# Patient Record
Sex: Male | Born: 2000 | Race: Black or African American | Hispanic: No | Marital: Single | Smoking: Never smoker
Health system: Southern US, Community
[De-identification: ages and names within clinical notes are randomized; demographics above are authoritative.]

## PROBLEM LIST (undated history)

## (undated) ENCOUNTER — Emergency Department (HOSPITAL_COMMUNITY): Payer: Medicaid Other

## (undated) DIAGNOSIS — K219 Gastro-esophageal reflux disease without esophagitis: Secondary | ICD-10-CM

## (undated) HISTORY — DX: Gastro-esophageal reflux disease without esophagitis: K21.9

## (undated) HISTORY — PX: UPPER GASTROINTESTINAL ENDOSCOPY: SHX188

---
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
## 2021-12-15 ENCOUNTER — Emergency Department (HOSPITAL_COMMUNITY)
Admission: EM | Admit: 2021-12-15 | Discharge: 2021-12-15 | Disposition: A | Discharge status: Home / Self Care | Payer: Medicaid Other | Attending: Emergency Medicine | Admitting: Emergency Medicine

## 2021-12-15 ENCOUNTER — Encounter (HOSPITAL_COMMUNITY): Payer: Self-pay | Admitting: Emergency Medicine

## 2021-12-15 ENCOUNTER — Other Ambulatory Visit: Payer: Self-pay

## 2021-12-15 ENCOUNTER — Emergency Department (HOSPITAL_COMMUNITY): Payer: Medicaid Other

## 2021-12-15 DIAGNOSIS — R509 Fever, unspecified: Secondary | ICD-10-CM | POA: Insufficient documentation

## 2021-12-15 DIAGNOSIS — R1010 Upper abdominal pain, unspecified: Secondary | ICD-10-CM | POA: Insufficient documentation

## 2021-12-15 DIAGNOSIS — R112 Nausea with vomiting, unspecified: Secondary | ICD-10-CM | POA: Insufficient documentation

## 2021-12-15 LAB — URINALYSIS, ROUTINE W REFLEX MICROSCOPIC
Glucose, UA: NEGATIVE mg/dL
Hgb urine dipstick: NEGATIVE
Ketones, ur: 15 mg/dL — AB
Leukocytes,Ua: NEGATIVE
Nitrite: NEGATIVE
Protein, ur: NEGATIVE mg/dL
Specific Gravity, Urine: >1.03 — ABNORMAL HIGH (ref 1.005–1.030)
pH: 6 (ref 5.0–8.0)

## 2021-12-15 LAB — COMPREHENSIVE METABOLIC PANEL
ALT: 14 U/L (ref 0–44)
AST: 19 U/L (ref 15–41)
Albumin: 4.6 g/dL (ref 3.5–5.0)
Alkaline Phosphatase: 93 U/L (ref 38–126)
Anion gap: 13 (ref 5–15)
BUN: 15 mg/dL (ref 6–20)
CO2: 22 mmol/L (ref 22–32)
Calcium: 9.7 mg/dL (ref 8.9–10.3)
Chloride: 105 mmol/L (ref 98–111)
Creatinine, Ser: 0.94 mg/dL (ref 0.61–1.24)
GFR, Estimated: >60 mL/min (ref >60)
Glucose, Bld: 99 mg/dL (ref 70–99)
Potassium: 3.5 mmol/L (ref 3.5–5.1)
Sodium: 140 mmol/L (ref 135–145)
Total Bilirubin: 2.8 mg/dL — ABNORMAL HIGH (ref 0.3–1.2)
Total Protein: 7.4 g/dL (ref 6.5–8.1)

## 2021-12-15 LAB — CBC
HCT: 44.5 % (ref 39.0–52.0)
Hemoglobin: 14.8 g/dL (ref 13.0–17.0)
MCH: 31.3 pg (ref 26.0–34.0)
MCHC: 33.3 g/dL (ref 30.0–36.0)
MCV: 94.1 fL (ref 80.0–100.0)
Platelets: 262 10*3/uL (ref 150–400)
RBC: 4.73 MIL/uL (ref 4.22–5.81)
RDW: 13.2 % (ref 11.5–15.5)
WBC: 9.8 10*3/uL (ref 4.0–10.5)
nRBC: 0 % (ref 0.0–0.2)

## 2021-12-15 LAB — LIPASE, BLOOD: Lipase: 25 U/L (ref 11–51)

## 2021-12-15 MED ORDER — IOHEXOL 350 MG/ML SOLN
100.0000 mL | Freq: Once | INTRAVENOUS | Status: AC | PRN
  Administered 2021-12-15: 100 mL via INTRAVENOUS

## 2021-12-15 MED ORDER — ONDANSETRON HCL 4 MG PO TABS: 4.0000 mg | ORAL_TABLET | Freq: Three times a day (TID) | ORAL | 0 refills | Status: DC | PRN

## 2021-12-15 MED ORDER — SODIUM CHLORIDE 0.9 % IV BOLUS
1000.0000 mL | Freq: Once | INTRAVENOUS | Status: AC
  Administered 2021-12-15: 1000 mL via INTRAVENOUS

## 2021-12-15 MED ORDER — ONDANSETRON HCL 4 MG/2ML IJ SOLN
4.0000 mg | Freq: Once | INTRAMUSCULAR | Status: AC
  Administered 2021-12-15: 4 mg via INTRAVENOUS
  Filled 2021-12-15: qty 2

## 2021-12-15 NOTE — ED Triage Notes (Signed)
Patient c/o upper abdominal pain with nausea and vomiting onset of yesterday. Denies any urinary symptoms.

## 2021-12-15 NOTE — ED Provider Notes (Signed)
[facility] EMERGENCY DEPARTMENT Provider Note   CSN: [id number] Arrival date & time: 12/15/21  2993     History  Chief Complaint  Patient presents with   Abdominal Pain    Walter Mathews is a 21 y.o. male who presents to the ED complaining of upper abdominal pain onset yesterday. Has associated subjective, fever, nausea, vomiting. No sick contacts at home. No meds tried PTA. Still has his gallbladder and appendix. Denies urinary symptoms. Denies history of GERD. No new meds or foods.    The history is provided by the patient. No language interpreter was used.       Home Medications Prior to Admission medications   Medication Sig Start Date End Date Taking? Authorizing Provider  ondansetron (ZOFRAN) 4 MG tablet Take 1 tablet (4 mg total) by mouth every 8 (eight) hours as needed for nausea or vomiting. 12/15/21  Yes Masayoshi Couzens A, PA-C  ibuprofen (ADVIL,MOTRIN) 200 MG tablet Take 100 mg by mouth every 6 (six) hours as needed. For pain    [provider]      Allergies    Patient has no known allergies.    Review of Systems   Review of Systems  Constitutional:  Positive for fever (subjective).  Gastrointestinal:  Positive for abdominal pain, nausea and vomiting.  Genitourinary:  Negative for dysuria and hematuria.  All other systems reviewed and are negative.   Physical Exam Updated Vital Signs BP (!) 145/75 (BP Location: Right Arm)   Pulse 60   Temp 98.6 F (37 C) (Oral)   Resp 14   Ht 6' (1.829 m)   SpO2 100%  Physical Exam Vitals and nursing note reviewed.  Constitutional:      General: He is not in acute distress.    Appearance: He is not diaphoretic.  HENT:     Head: Normocephalic and atraumatic.     Mouth/Throat:     Pharynx: No oropharyngeal exudate.  Eyes:     General: No scleral icterus.    Conjunctiva/sclera: Conjunctivae normal.  Cardiovascular:     Rate and Rhythm: Normal rate and regular rhythm.      Pulses: Normal pulses.     Heart sounds: Normal heart sounds.  Pulmonary:     Effort: Pulmonary effort is normal. No respiratory distress.     Breath sounds: Normal breath sounds. No wheezing.  Abdominal:     General: Bowel sounds are normal.     Palpations: Abdomen is soft. There is no mass.     Tenderness: There is abdominal tenderness. There is no guarding or rebound.     Comments: Upper abdominal TTP on exam.   Musculoskeletal:        General: Normal range of motion.     Cervical back: Normal range of motion and neck supple.  Skin:    General: Skin is warm and dry.  Neurological:     Mental Status: He is alert.  Psychiatric:        Behavior: Behavior normal.     ED Results / Procedures / Treatments   Labs (all labs ordered are listed, but only abnormal results are displayed) Labs Reviewed  COMPREHENSIVE METABOLIC PANEL - Abnormal; Notable for the following components:      Result Value   Total Bilirubin 2.8 (*)    All other components within normal limits  URINALYSIS, ROUTINE W REFLEX MICROSCOPIC - Abnormal; Notable for the following components:   Specific Gravity, Urine >1.030 (*)  Bilirubin Urine SMALL (*)    Ketones, ur 15 (*)    All other components within normal limits  LIPASE, BLOOD  CBC    EKG None  Radiology CT ABDOMEN PELVIS W CONTRAST  Result Date: 12/15/2021 CLINICAL DATA:  A 21 year old male presents for evaluation of abdominal pain, upper abdominal pain with nausea and vomiting that began yesterday. EXAM: CT ABDOMEN AND PELVIS WITH CONTRAST TECHNIQUE: Multidetector CT imaging of the abdomen and pelvis was performed using the standard protocol following bolus administration of intravenous contrast. RADIATION DOSE REDUCTION: This exam was performed according to the departmental dose-optimization program which includes automated exposure control, adjustment of the mA and/or kV according to patient size and/or use of iterative reconstruction technique.  CONTRAST:  OMNIPAQUE IOHEXOL 350 MG/ML SOLN COMPARISON:  None Available. FINDINGS: Lower chest: Lung bases are clear. No effusion. No consolidative changes. Hepatobiliary: No focal, suspicious hepatic lesion. No pericholecystic stranding. No pericholecystic stranding or signs of overt biliary duct distension. Question mild low-attenuation along portal tracks which could represent minimal intrahepatic biliary duct distension versus is mild periportal edema. Pancreas: Mildly indistinct appearance of the pancreas, could be related to technical factors and reduced though CT. No peripancreatic fluid. No ductal dilation or gross contour abnormality. Spleen: Normal. Adrenals/Urinary Tract: Adrenal glands are unremarkable. Symmetric renal enhancement. No sign of hydronephrosis. No suspicious renal lesion or perinephric stranding. Urinary bladder is grossly unremarkable. Stomach/Bowel: Normal appendix. Stomach is under distended without signs of adjacent stranding. No sign of small bowel obstruction. No acute small bowel process. Colon is largely collapsed. No stranding adjacent to the colon. Vascular/Lymphatic: Aorta with smooth contours. IVC with smooth contours. No aneurysmal dilation of the abdominal aorta. There is no gastrohepatic or hepatoduodenal ligament lymphadenopathy. No retroperitoneal or mesenteric lymphadenopathy. No pelvic sidewall lymphadenopathy. Reproductive: Unremarkable by CT. Other: No pneumoperitoneum. Trace free fluid in the pelvis is of uncertain significance and is non loculated in appearance. Musculoskeletal: No acute or significant osseous findings. IMPRESSION: 1. Mildly indistinct appearance of the pancreas, could be related to technical factors and reduced though CT. Correlate with pancreatic enzymes. 2. Trace free fluid in the pelvis is of uncertain significance and is non loculated in appearance. May relate to underlying inflammation and could be seen in a variety of conditions including  gastroenteritis. 3. Query trace intrahepatic biliary duct distension, significance uncertain versus mild periportal edema. 4. Normal appendix. Electronically Signed   By: Donzetta Kohut M.D.   On: 12/15/2021 12:55    Procedures Procedures    Medications Ordered in ED Medications  sodium chloride 0.9 % bolus 1,000 mL (0 mLs Intravenous Stopped 12/15/21 1226)  ondansetron (ZOFRAN) injection 4 mg (4 mg Intravenous Given 12/15/21 1123)  iohexol (OMNIPAQUE) 350 MG/ML injection 100 mL (100 mLs Intravenous Contrast Given 12/15/21 1235)    ED Course/ Medical Decision Making/ A&P Clinical Course as of 12/16/21 3244  Tue Dec 15, 2021  1225 Patient reevaluated and resting comfortably on stretcher.  Notes improvement of symptoms with treatment regimen in the ED. [SB]    Clinical Course User Index [SB] Tavien Chestnut A, PA-C                           Medical Decision Making Amount and/or Complexity of Data Reviewed Labs: ordered. Radiology: ordered.  Risk Prescription drug management.   Patient presents to the emergency department with upper abdominal pain onset yesterday.  Vital signs stable, patient afebrile, not tachycardic or hypoxic.  On exam patient with mild tenderness to palpation in the epigastric region.  No acute cardiovascular or respiratory exam findings. Differential diagnosis includes pancreatitis, cholecystitis, GERD.   Labs:  I ordered, and personally interpreted labs.  The pertinent results include:  Lipase at 25 and unremarkable CBC unremarkable.  CMP with slightly elevated bilirubin at 2.8, otherwise unremarkable.  Urinalysis unremarkable.   Imaging: I ordered imaging studies including CT abdomen pelvis I independently visualized and interpreted imaging which showed  1. Mildly indistinct appearance of the pancreas, could be related to  technical factors and reduced though CT. Correlate with pancreatic  enzymes.  2. Trace free fluid in the pelvis is of uncertain  significance and  is non loculated in appearance. May relate to underlying  inflammation and could be seen in a variety of conditions including  gastroenteritis.  3. Query trace intrahepatic biliary duct distension, significance  uncertain versus mild periportal edema.  4. Normal appendix.   I agree with the radiologist interpretation  Medications:  I ordered medication including IV fluids and Zofran for symptom management  Reevaluation of the patient after these medicines and interventions, I reevaluated the patient and found that they have improved I have reviewed the patients home medicines and have made adjustments as needed  Disposition: Presentation suspicious for viral etiology of abdominal pain, nausea, vomiting.  Doubt GERD at this time.  Doubt pancreatitis at this time, lipase unremarkable, patient afebrile, no elevated WBC in the ED.  Doubt cholecystitis at this time. After consideration of the diagnostic results and the patients response to treatment, I feel that the patient would benefit from Discharge home.  Patient will be discharged home with a prescription for Zofran.  Supportive care measures and strict return precautions discussed with patient at bedside. Pt acknowledges and verbalizes understanding. Pt appears safe for discharge. Follow up as indicated in discharge paperwork.   This chart was dictated using voice recognition software, Dragon. Despite the best efforts of this provider to proofread and correct errors, errors may still occur which can change documentation meaning.  Final Clinical Impression(s) / ED Diagnoses Final diagnoses:  Pain of upper abdomen    Rx / DC Orders ED Discharge Orders          Ordered    ondansetron (ZOFRAN) 4 MG tablet  Every 8 hours PRN        12/15/21 1312              Ryelan Kazee A, PA-C 12/16/21 0715    Lacretia Leigh, MD 12/17/21 [phone]

## 2022-10-19 ENCOUNTER — Emergency Department (HOSPITAL_COMMUNITY)
Admission: EM | Admit: 2022-10-19 | Discharge: 2022-10-19 | Disposition: A | Discharge status: Home / Self Care | Payer: Self-pay | Attending: Emergency Medicine | Admitting: Emergency Medicine

## 2022-10-19 ENCOUNTER — Emergency Department (HOSPITAL_COMMUNITY): Payer: Medicaid Other

## 2022-10-19 ENCOUNTER — Encounter (HOSPITAL_COMMUNITY): Payer: Self-pay

## 2022-10-19 DIAGNOSIS — M25511 Pain in right shoulder: Secondary | ICD-10-CM

## 2022-10-19 DIAGNOSIS — M62838 Other muscle spasm: Secondary | ICD-10-CM | POA: Insufficient documentation

## 2022-10-19 MED ORDER — IBUPROFEN 400 MG PO TABS
600.0000 mg | ORAL_TABLET | Freq: Once | ORAL | Status: AC
  Administered 2022-10-19: 600 mg via ORAL
  Filled 2022-10-19: qty 1

## 2022-10-19 MED ORDER — ACETAMINOPHEN 500 MG PO TABS
1000.0000 mg | ORAL_TABLET | Freq: Once | ORAL | Status: AC
  Administered 2022-10-19: 1000 mg via ORAL
  Filled 2022-10-19: qty 2

## 2022-10-19 MED ORDER — LIDOCAINE 5 % EX PTCH
1.0000 | MEDICATED_PATCH | CUTANEOUS | Status: DC
  Administered 2022-10-19: 1 via TRANSDERMAL
  Filled 2022-10-19: qty 1

## 2022-10-19 MED ORDER — IBUPROFEN 600 MG PO TABS: 600.0000 mg | ORAL_TABLET | Freq: Four times a day (QID) | ORAL | 0 refills | Status: DC | PRN

## 2022-10-19 MED ORDER — METHOCARBAMOL 500 MG PO TABS: 500.0000 mg | ORAL_TABLET | Freq: Two times a day (BID) | ORAL | 0 refills | Status: DC

## 2022-10-19 NOTE — ED Triage Notes (Signed)
Right shoulder pain that started while at work. Works as a [employer] worker. Pt denies injury or falls. Able to move RUE today but reports unable to move right arm last night when pain started.

## 2022-10-19 NOTE — Discharge Instructions (Addendum)
You were seen in the ER today for evaluation of right shoulder pain.  Likely given muscle spasm.  For this, recommend gentle stretching and heat and ice to the area.  Can also try over-the-counter lidocaine patches.  I prescribed you some muscle relaxers for you to take as needed.  Please do not drive or operate heavy machinery on these medications as they can make you sleepy.  I recommended 1000 g of Tylenol and/or 600 mg of ibuprofen every 6 hours as needed for pain.  If you have any concerns with new or worsening symptoms, please return to the nearest emergency room for evaluation.  Contact a doctor if: Your pain gets worse. Medicine does not help your pain. You have new pain in your arm, hand, or fingers. You loosen your sling and your arm, hand, or fingers: Tingle. Are numb. Are swollen. Get help right away if: Your arm, hand, or fingers turn white or blue.

## 2022-10-19 NOTE — ED Provider Notes (Signed)
[place] EMERGENCY DEPARTMENT AT [facility] Provider Note   CSN: [id number] Arrival date & time: 10/19/22  1030     History  Chief Complaint  Patient presents with   Shoulder Pain    Walter Mathews is a 22 y.o. male presents to the ER for evaluation of right shoulder pain since last night.  Patient works as a Nature conservation officer at [employer] reports that he had some pain in his upper shoulder whenever he was lifting a heavy box.  Reports that he has a lot of pain in his shoulder last time he had trouble moving hours feeling better this morning.  Denies any numbness or tingling.  Denies any chest pain or shortness of breath. He has not tried any medications for pain.  Denies any head neck or back pain.  No known drug allergies.   Shoulder Pain Associated symptoms: no back pain, no fever and no neck pain        Home Medications Prior to Admission medications   Medication Sig Start Date End Date Taking? Authorizing Provider  ibuprofen (ADVIL,MOTRIN) 200 MG tablet Take 100 mg by mouth every 6 (six) hours as needed. For pain    [provider]  ondansetron (ZOFRAN) 4 MG tablet Take 1 tablet (4 mg total) by mouth every 8 (eight) hours as needed for nausea or vomiting. 12/15/21   Blue, Soijett A, PA-C      Allergies    Patient has no known allergies.    Review of Systems   Review of Systems  Constitutional:  Negative for chills and fever.  Respiratory:  Negative for shortness of breath.   Cardiovascular:  Negative for chest pain.  Musculoskeletal:  Positive for arthralgias and myalgias. Negative for back pain and neck pain.  Neurological:  Negative for weakness, numbness and headaches.    Physical Exam Updated Vital Signs BP 126/72   Pulse (!) 101   Temp 99.6 F (37.6 C) (Oral)   Resp 16   Ht 6' (1.829 m)   Wt 54.4 kg   SpO2 99%   BMI 16.27 kg/m  Physical Exam Vitals and nursing note reviewed.  Constitutional:      General: He is not in  acute distress.    Appearance: Normal appearance. He is not ill-appearing or toxic-appearing.     Comments: On phone, in no acute distress  HENT:     Mouth/Throat:     Mouth: Mucous membranes are moist.  Eyes:     General: No scleral icterus. Pulmonary:     Effort: Pulmonary effort is normal. No respiratory distress.  Musculoskeletal:       Arms:     Comments: Tenderness to the above marked area with palpable muscle spasm.  Patient able to raise arms without any drift.  Strength is 5-5 bilaterally and patient is upper extremities.  Sensation intact per patient.  Coloration temperature of feel and appear symmetric.  Palpable radial pulses are symmetric as well.  Brisk cap refill.  Compartments are soft.  No signs of trauma to the back.  There is no induration, fluctuance, increased warmth, or erythema to the back.  No midline tenderness of the cervical, thoracic, or lumbar spine.  Skin:    General: Skin is dry.     Findings: No rash.  Neurological:     General: No focal deficit present.     Mental Status: He is alert. Mental status is at baseline.  Psychiatric:  Mood and Affect: Mood normal.     ED Results / Procedures / Treatments   Labs (all labs ordered are listed, but only abnormal results are displayed) Labs Reviewed - No data to display  EKG None  Radiology DG Shoulder Right  Result Date: 10/19/2022 CLINICAL DATA:  Posterosuperior right shoulder pain for 2 days EXAM: RIGHT SHOULDER - 2+ VIEW COMPARISON:  None Available. FINDINGS: No fracture, malalignment, or acute bony findings. Subacromial morphology is type 2 (curved). IMPRESSION: 1. No acute findings. If pain persists despite conservative therapy, MRI may be warranted for further characterization. Electronically Signed   By: Gaylyn Rong M.D.   On: 10/19/2022 11:35    Procedures Procedures    Medications Ordered in ED Medications  acetaminophen (TYLENOL) tablet 1,000 mg (has no administration in time  range)  ibuprofen (ADVIL) tablet 600 mg (has no administration in time range)  lidocaine (LIDODERM) 5 % 1 patch (has no administration in time range)    ED Course/ Medical Decision Making/ A&P                            Medical Decision Making Amount and/or Complexity of Data Reviewed Radiology: ordered.  Risk OTC drugs. Prescription drug management.   22 y.o. male presents to the ER today for evaluation of right shoulder pain. Differential diagnosis includes but is not limited to sprain, strain, muscle spasm, dislocation, fracture, referred pain. Vital signs unremarkable. Physical exam as noted above.   XR right shoulder shows 1. No acute findings. If pain persists despite conservative therapy, MRI may be warranted for further characterization.  Patient was ordered Tylenol and ibuprofen while here and a lidocaine patch.  Will send him home with some muscle.  Encouraged him to try Tylenol ibuprofen at home.  He has palpable pulses and is neurovascularly intact.  There is no signs of compartment syndrome.  Strength is intact and equal.  He has a palpable muscle spasm to the more superior aspect of the right shoulder, likely what is causing his pain from doing heavy lifting.  He safer discharge home.  Recommended that if he has continued pain in his right shoulder follow-up with orthopedics.  We discussed plan at bedside. We discussed strict return precautions and red flag symptoms. The patient verbalized their understanding and agrees to the plan. The patient is stable and being discharged home in good condition.  Portions of this report may have been transcribed using voice recognition software. Every effort was made to ensure accuracy; however, inadvertent computerized transcription errors may be present.   Final Clinical Impression(s) / ED Diagnoses Final diagnoses:  Acute pain of right shoulder  Muscle spasm    Rx / DC Orders ED Discharge Orders          Ordered    ibuprofen  (ADVIL) 600 MG tablet  Every 6 hours PRN        10/19/22 1235    methocarbamol (ROBAXIN) 500 MG tablet  2 times daily        10/19/22 1235              Achille Rich, [place] 10/19/22 1237    Loetta Rough, MD 10/22/22 1235

## 2022-10-27 ENCOUNTER — Other Ambulatory Visit: Payer: Self-pay

## 2022-10-27 ENCOUNTER — Ambulatory Visit (INDEPENDENT_AMBULATORY_CARE_PROVIDER_SITE_OTHER): Payer: Self-pay

## 2022-10-27 ENCOUNTER — Encounter (HOSPITAL_COMMUNITY): Payer: Self-pay | Admitting: *Deleted

## 2022-10-27 ENCOUNTER — Ambulatory Visit (HOSPITAL_COMMUNITY)
Admission: EM | Admit: 2022-10-27 | Discharge: 2022-10-27 | Disposition: A | Discharge status: Home / Self Care | Payer: Self-pay | Attending: Emergency Medicine | Admitting: Emergency Medicine

## 2022-10-27 DIAGNOSIS — K5900 Constipation, unspecified: Secondary | ICD-10-CM

## 2022-10-27 DIAGNOSIS — R21 Rash and other nonspecific skin eruption: Secondary | ICD-10-CM

## 2022-10-27 MED ORDER — GLYCERIN (ADULT) 2 G RE SUPP: 1.0000 | RECTAL | 0 refills | Status: DC | PRN

## 2022-10-27 MED ORDER — POLYETHYLENE GLYCOL 3350 17 G PO PACK: 17.0000 g | PACK | Freq: Every day | ORAL | 0 refills | Status: DC

## 2022-10-27 MED ORDER — DOCUSATE SODIUM 100 MG PO CAPS: 100.0000 mg | ORAL_CAPSULE | Freq: Two times a day (BID) | ORAL | 0 refills | Status: DC

## 2022-10-27 MED ORDER — DIPHENHYDRAMINE HCL 25 MG PO TABS: 25.0000 mg | ORAL_TABLET | Freq: Four times a day (QID) | ORAL | 0 refills | Status: DC | PRN

## 2022-10-27 NOTE — Discharge Instructions (Addendum)
Your x-ray did not show evidence of obstruction.  It did show significant stool burden, you are constipated.  Please stop using the milk of magnesia as this is given you a rash.  You can do an over-the-counter moisturizer like CeraVe or Cetaphil that is unscented and nonirritating.  You can also take Benadryl 25 mg every 6 hours as needed for rash and itching.  Do not drink or drive on this medication as it may make you drowsy.  For moderate to severe constipation (not having a bowel movement in more than 3 days) try to use Miralax or Glycerin suppository once daily until you have a good bowel movement.  It is not a good idea to use an enema or laxatives daily. If you find you are doing this, then please follow up with a gastroenterologist. Otherwise, a medication you could use daily to help with promoting bowel movements is docusate (Colace) 100mg . It is okay to use this 1-2 times daily as a stool softener.  Try to stay active physically including regular exercise 2-3 times a week.  Make sure you hydrate well every day with about 64 ounces of water daily (that is 2 liters).  Try to avoid carb heavy foods, dairy. This includes cutting out breads, pasta, pizza, pastries, potatoes, rice, starchy foods in general. Eat more fiber as listed below:  Salads - kale, spinach, cabbage, spring mix, arugula Fruits - avocadoes, berries (blueberries, raspberries, blackberries), apples, oranges, pomegranate, grapefruit, kiwi Vegetables - asparagus, cauliflower, broccoli, green beans, brussel sprouts, bell peppers, beets; stay away from or limit starchy vegetables like potatoes, carrots, peas Other general foods - kidney beans, egg whites, almonds, walnuts, sunflower seeds, pumpkin seeds, fat free yogurt, almond milk, flax seeds, quinoa, oats  Meat - It is better to eat lean meats and limit your red meat including pork to once a week.  Wild caught fish, chicken breast are good options as they tend to be leaner sources of  good protein. Still be mindful of the sodium labels for the meats you buy.  DO NOT EAT ANY FOODS ON THIS LIST THAT YOU ARE ALLERGIC TO. For more specific needs, I highly recommend consulting a dietician or nutritionist but this can definitely be a good starting point.   Please return to clinic or seek immediate care if you develop worsening of abdominal pain, or unable to have a bowel movement despite interventions, or develop vomiting.

## 2022-10-27 NOTE — ED Provider Notes (Signed)
[facility]    CSN: [id number] Arrival date & time: 10/27/22  0850      History   Chief Complaint Chief Complaint  Patient presents with   Abdominal Pain    Dry flaky skinSkin itchyConstipationShoulder pain(I have been experiencing these pains for over a week) - Entered by patient   GI Problem   Rash    HPI Walter Mathews is a 22 y.o. male.   Patient reports he has been constipated since his ER visit on April 30.  At this visit he was given Tylenol, ibuprofen and Robaxin.  He reports he normally does not eat a lot, does have a small frame.  But his appetite has been decreased more than normal.  He is also passing less gas than normal.  He reports he has been nauseous, denies emesis.  He has been taking milk of magnesia, however, he feels like he is having dry skin due to this.  He does have flaking dry skin on his face.  He took milk of mag 2 times last week and once yesterday.  Yesterday he did have a bowel movement where he was straining and reports it was soft and diarrhea-like.  He does have a history of constipation, normally has not had to take anything for this, it resolves on its own.  He last ate and drank yesterday.   The history is provided by the patient and medical records.  Abdominal Pain Associated symptoms: constipation and nausea   Associated symptoms: no chest pain, no chills, no cough, no dysuria, no fatigue, no fever, no shortness of breath and no vomiting   GI Problem Associated symptoms include abdominal pain. Pertinent negatives include no chest pain and no shortness of breath.  Rash Associated symptoms: abdominal pain and nausea   Associated symptoms: no fatigue, no fever, no shortness of breath and not vomiting     History reviewed. No pertinent past medical history.  Patient Active Problem List   Diagnosis Date Noted   Right eyelid laceration 10/15/2016    History reviewed. No pertinent surgical history.     Home  Medications    Prior to Admission medications   Medication Sig Start Date End Date Taking? Authorizing Provider  diphenhydrAMINE (BENADRYL) 25 MG tablet Take 1 tablet (25 mg total) by mouth every 6 (six) hours as needed. 10/27/22  Yes Rinaldo Ratel, [place] N, FNP  docusate sodium (COLACE) 100 MG capsule Take 1 capsule (100 mg total) by mouth every 12 (twelve) hours. 10/27/22  Yes Rinaldo Ratel, [place] N, FNP  glycerin adult 2 g suppository Place 1 suppository rectally as needed for constipation. 10/27/22  Yes Rinaldo Ratel, [place] N, FNP  polyethylene glycol (MIRALAX) 17 g packet Take 17 g by mouth daily. 10/27/22  Yes Rinaldo Ratel, [place] N, FNP  ibuprofen (ADVIL) 600 MG tablet Take 1 tablet (600 mg total) by mouth every 6 (six) hours as needed. 10/19/22   Achille Rich, PA-C  methocarbamol (ROBAXIN) 500 MG tablet Take 1 tablet (500 mg total) by mouth 2 (two) times daily. 10/19/22   Achille Rich, PA-C  ondansetron (ZOFRAN) 4 MG tablet Take 1 tablet (4 mg total) by mouth every 8 (eight) hours as needed for nausea or vomiting. 12/15/21   Blue, Soijett A, PA-C    Family History History reviewed. No pertinent family history.  Social History Social History   Tobacco Use   Smoking status: Never   Smokeless tobacco: Never  Substance Use Topics   Alcohol use: No   Drug  use: No     Allergies   Patient has no known allergies.   Review of Systems Review of Systems  Constitutional:  Negative for chills, fatigue and fever.  Respiratory:  Negative for cough and shortness of breath.   Cardiovascular:  Negative for chest pain.  Gastrointestinal:  Positive for abdominal pain, constipation and nausea. Negative for abdominal distention and vomiting.  Genitourinary:  Negative for dysuria.  Skin:  Positive for rash.     Physical Exam Triage Vital Signs ED Triage Vitals  Enc Vitals Group     BP 10/27/22 0929 130/72     Pulse Rate 10/27/22 0929 78     Resp 10/27/22 0929 18     Temp 10/27/22 0929 98.5 F (36.9 C)      Temp src --      SpO2 10/27/22 0929 97 %     Weight --      Height --      Head Circumference --      Peak Flow --      Pain Score 10/27/22 0925 0     Pain Loc --      Pain Edu? --      Excl. in GC? --    No data found.  Updated Vital Signs BP 130/72   Pulse 78   Temp 98.5 F (36.9 C)   Resp 18   SpO2 97%   Visual Acuity Right Eye Distance:   Left Eye Distance:   Bilateral Distance:    Right Eye Near:   Left Eye Near:    Bilateral Near:     Physical Exam Vitals and nursing note reviewed.  Constitutional:      Appearance: Normal appearance. He is well-developed.  HENT:     Head: Normocephalic and atraumatic.     Right Ear: External ear normal.     Left Ear: External ear normal.     Nose: Nose normal.     Mouth/Throat:     Mouth: Mucous membranes are moist.  Eyes:     General: No scleral icterus.    Conjunctiva/sclera: Conjunctivae normal.  Cardiovascular:     Rate and Rhythm: Normal rate.  Pulmonary:     Effort: No respiratory distress.  Abdominal:     General: Abdomen is flat. Bowel sounds are normal.     Palpations: Abdomen is soft.     Tenderness: There is no abdominal tenderness. There is no guarding or rebound. Negative signs include Murphy's sign and Rovsing's sign.     Hernia: No hernia is present.  Skin:    General: Skin is warm and dry.  Neurological:     General: No focal deficit present.     Mental Status: He is alert and oriented to person, place, and time.  Psychiatric:        Mood and Affect: Mood normal.      UC Treatments / Results  Labs (all labs ordered are listed, but only abnormal results are displayed) Labs Reviewed - No data to display  EKG   Radiology DG Abd 1 View  Result Date: 10/27/2022 CLINICAL DATA:  Constipation. EXAM: ABDOMEN - 1 VIEW COMPARISON:  CT abdomen/pelvis 12/15/2021 FINDINGS: There is a nonobstructive bowel gas pattern. There is no new large colonic stool burden. There is no gross organomegaly or  abnormal soft tissue calcification there is no definite free intraperitoneal air. There is no acute osseous abnormality. IMPRESSION: Unremarkable KUB. Electronically Signed   By: Selena Lesser.D.  On: 10/27/2022 10:22    Procedures Procedures (including critical care time)  Medications Ordered in UC Medications - No data to display  Initial Impression / Assessment and Plan / UC Course  I have reviewed the triage vital signs and the nursing notes.  Pertinent labs & imaging results that were available during my care of the patient were reviewed by me and considered in my medical decision making (see chart for details).  Vitals and triage reviewed, patient is hemodynamically stable.  Patient with a dry scaling flaking rash to his face, suspect this is a side effect of the milk of magnesia.  Advised to do topical facial moisturizer and to avoid the milk of mag in the future.  Abdomen is soft and nontender with active bowel sounds, low concern for acute abdomen.  Patient reports constipation, abdominal x-ray obtained that was negative for obstruction, did show significant stool burden.  Symptomatic management for constipation discussed as well as return and emergency precautions.  Advised Benadryl every 6 hours as needed for itching and rash.  Patient verbalized understanding, no questions at this time.      Final Clinical Impressions(s) / UC Diagnoses   Final diagnoses:  Constipation, unspecified constipation type  Rash and nonspecific skin eruption     Discharge Instructions      Your x-ray did not show evidence of obstruction.  It did show significant stool burden, you are constipated.  Please stop using the milk of magnesia as this is given you a rash.  You can do an over-the-counter moisturizer like CeraVe or Cetaphil that is unscented and nonirritating.  You can also take Benadryl 25 mg every 6 hours as needed for rash and itching.  Do not drink or drive on this medication as it may  make you drowsy.  For moderate to severe constipation (not having a bowel movement in more than 3 days) try to use Miralax or Glycerin suppository once daily until you have a good bowel movement.  It is not a good idea to use an enema or laxatives daily. If you find you are doing this, then please follow up with a gastroenterologist. Otherwise, a medication you could use daily to help with promoting bowel movements is docusate (Colace) 100mg . It is okay to use this 1-2 times daily as a stool softener.  Try to stay active physically including regular exercise 2-3 times a week.  Make sure you hydrate well every day with about 64 ounces of water daily (that is 2 liters).  Try to avoid carb heavy foods, dairy. This includes cutting out breads, pasta, pizza, pastries, potatoes, rice, starchy foods in general. Eat more fiber as listed below:  Salads - kale, spinach, cabbage, spring mix, arugula Fruits - avocadoes, berries (blueberries, raspberries, blackberries), apples, oranges, pomegranate, grapefruit, kiwi Vegetables - asparagus, cauliflower, broccoli, green beans, brussel sprouts, bell peppers, beets; stay away from or limit starchy vegetables like potatoes, carrots, peas Other general foods - kidney beans, egg whites, almonds, walnuts, sunflower seeds, pumpkin seeds, fat free yogurt, almond milk, flax seeds, quinoa, oats  Meat - It is better to eat lean meats and limit your red meat including pork to once a week.  Wild caught fish, chicken breast are good options as they tend to be leaner sources of good protein. Still be mindful of the sodium labels for the meats you buy.  DO NOT EAT ANY FOODS ON THIS LIST THAT YOU ARE ALLERGIC TO. For more specific needs, I highly recommend consulting a  dietician or nutritionist but this can definitely be a good starting point.   Please return to clinic or seek immediate care if you develop worsening of abdominal pain, or unable to have a bowel movement despite  interventions, or develop vomiting.      ED Prescriptions     Medication Sig Dispense Auth. Provider   docusate sodium (COLACE) 100 MG capsule Take 1 capsule (100 mg total) by mouth every 12 (twelve) hours. 60 capsule Rinaldo Ratel, [place], [place]   polyethylene glycol (MIRALAX) 17 g packet Take 17 g by mouth daily. 14 each Gamble Enderle, [place] N, FNP   glycerin adult 2 g suppository Place 1 suppository rectally as needed for constipation. 12 suppository Rinaldo Ratel, [place] N, [place]   diphenhydrAMINE (BENADRYL) 25 MG tablet Take 1 tablet (25 mg total) by mouth every 6 (six) hours as needed. 30 tablet Flornce Record, [place] N, [place]      PDMP not reviewed this encounter.   Fotini Lemus, [place] N, [place] 10/27/22 [id number]

## 2022-10-27 NOTE — ED Triage Notes (Addendum)
Pt reports he has been constipated since he was seen in the ED on 10-19-22. Pt took MOM and feels he has a rash on his face fro MOM. Pt also reports he has not had a BM since taking MOM 3 times 9 WED,Thur,Fri) last week.  Pt reports Pon Rt side of ABD occures after he eats.

## 2024-01-20 ENCOUNTER — Encounter (HOSPITAL_COMMUNITY): Payer: Self-pay

## 2024-01-20 ENCOUNTER — Ambulatory Visit (HOSPITAL_COMMUNITY): Payer: Self-pay

## 2024-01-20 ENCOUNTER — Ambulatory Visit (HOSPITAL_COMMUNITY)
Admission: EM | Admit: 2024-01-20 | Discharge: 2024-01-20 | Disposition: A | Discharge status: Home / Self Care | Attending: Family Medicine | Admitting: Family Medicine

## 2024-01-20 DIAGNOSIS — R1110 Vomiting, unspecified: Secondary | ICD-10-CM | POA: Insufficient documentation

## 2024-01-20 DIAGNOSIS — R1013 Epigastric pain: Secondary | ICD-10-CM | POA: Insufficient documentation

## 2024-01-20 LAB — CBC WITH DIFFERENTIAL/PLATELET
Abs Immature Granulocytes: 0.02 K/uL (ref 0.00–0.07)
Basophils Absolute: 0 K/uL (ref 0.0–0.1)
Basophils Relative: 1 %
Eosinophils Absolute: 0.1 K/uL (ref 0.0–0.5)
Eosinophils Relative: 1 %
HCT: 43.8 % (ref 39.0–52.0)
Hemoglobin: 14.6 g/dL (ref 13.0–17.0)
Immature Granulocytes: 0 %
Lymphocytes Relative: 20 %
Lymphs Abs: 1.3 K/uL (ref 0.7–4.0)
MCH: 31.3 pg (ref 26.0–34.0)
MCHC: 33.3 g/dL (ref 30.0–36.0)
MCV: 93.8 fL (ref 80.0–100.0)
Monocytes Absolute: 0.5 K/uL (ref 0.1–1.0)
Monocytes Relative: 8 %
Neutro Abs: 4.6 K/uL (ref 1.7–7.7)
Neutrophils Relative %: 70 %
Platelets: 277 K/uL (ref 150–400)
RBC: 4.67 MIL/uL (ref 4.22–5.81)
RDW: 13.1 % (ref 11.5–15.5)
WBC: 6.6 K/uL (ref 4.0–10.5)
nRBC: 0 % (ref 0.0–0.2)

## 2024-01-20 LAB — COMPREHENSIVE METABOLIC PANEL WITH GFR
ALT: 11 U/L (ref 0–44)
AST: 16 U/L (ref 15–41)
Albumin: 4.5 g/dL (ref 3.5–5.0)
Alkaline Phosphatase: 59 U/L (ref 38–126)
Anion gap: 9 (ref 5–15)
BUN: 15 mg/dL (ref 6–20)
CO2: 24 mmol/L (ref 22–32)
Calcium: 9.2 mg/dL (ref 8.9–10.3)
Chloride: 104 mmol/L (ref 98–111)
Creatinine, Ser: 0.76 mg/dL (ref 0.61–1.24)
GFR, Estimated: >60 mL/min (ref >60)
Glucose, Bld: 94 mg/dL (ref 70–99)
Potassium: 3.9 mmol/L (ref 3.5–5.1)
Sodium: 137 mmol/L (ref 135–145)
Total Bilirubin: 2.7 mg/dL — ABNORMAL HIGH (ref 0.0–1.2)
Total Protein: 7.3 g/dL (ref 6.5–8.1)

## 2024-01-20 MED ORDER — OMEPRAZOLE 40 MG PO CPDR: 40.0000 mg | DELAYED_RELEASE_CAPSULE | Freq: Every day | ORAL | 1 refills | Status: DC

## 2024-01-20 NOTE — Discharge Instructions (Addendum)
 Take omeprazole 40 mg--1 capsule daily for stomach acid.  We have drawn blood to check your blood counts and your kidney and liver function numbers.  Staff will notify you if anything is significantly abnormal.  You can use the QR code/website at the back of the summary paperwork to schedule yourself a new patient appointment with primary care  If your symptoms worsen or become more persistent, please go to the emergency room for further evaluation

## 2024-01-20 NOTE — ED Provider Notes (Signed)
 [facility]    CSN: [id number] Arrival date & time: 01/20/24  1103      History   Chief Complaint Chief Complaint  Patient presents with   Abdominal Pain    HPI Walter Mathews is a 23 y.o. male.    Abdominal Pain  Here for intermittent epigastric abdominal pain.  He has been having it for about 2 years.  It will bother him right after he eats a meal, especially if it is a normal sized meal.  He will feel bloated and he will burp a lot and sometimes will vomit.  He sometimes will have a bowel movement.  He is having a bowel movement most days.  No blood in the stool or in the emesis.   no fever.  He does have nausea sometimes in the mornings. He has not noted any particular foods make it worse but if he eats a larger meal it does make it more likely that this happens.  He did lose weight when this for started happening but he has been in constant about 120 pounds for the last year or more.  NKDA  He is not taking any medications for it.  He does not have a primary care  History reviewed. No pertinent past medical history.  Patient Active Problem List   Diagnosis Date Noted   Right eyelid laceration 10/15/2016    History reviewed. No pertinent surgical history.     Home Medications    Prior to Admission medications   Medication Sig Start Date End Date Taking? Authorizing Provider  omeprazole (PRILOSEC) 40 MG capsule Take 1 capsule (40 mg total) by mouth daily. 01/20/24  Yes Vonna Sharlet POUR, MD    Family History History reviewed. No pertinent family history.  Social History Social History   Tobacco Use   Smoking status: Never   Smokeless tobacco: Never  Vaping Use   Vaping status: Never Used  Substance Use Topics   Alcohol use: No   Drug use: Yes    Types: Marijuana     Allergies   Patient has no known allergies.   Review of Systems Review of Systems  Gastrointestinal:  Positive for abdominal pain.      Physical Exam Triage Vital Signs ED Triage Vitals  Encounter Vitals Group     BP 01/20/24 1141 119/77     Girls Systolic BP Percentile --      Girls Diastolic BP Percentile --      Boys Systolic BP Percentile --      Boys Diastolic BP Percentile --      Pulse Rate 01/20/24 1141 (!) 53     Resp 01/20/24 1141 16     Temp 01/20/24 1141 99 F (37.2 C)     Temp Source 01/20/24 1141 Oral     SpO2 01/20/24 1141 99 %     Weight --      Height --      Head Circumference --      Peak Flow --      Pain Score 01/20/24 1139 6     Pain Loc --      Pain Education --      Exclude from Growth Chart --    No data found.  Updated Vital Signs BP 119/77 (BP Location: Right Arm)   Pulse (!) 53   Temp 99 F (37.2 C) (Oral)   Resp 16   SpO2 99%   Visual Acuity Right Eye Distance:   Left  Eye Distance:   Bilateral Distance:    Right Eye Near:   Left Eye Near:    Bilateral Near:     Physical Exam Vitals reviewed.  Constitutional:      General: He is not in acute distress.    Appearance: He is not toxic-appearing.  HENT:     Mouth/Throat:     Mouth: Mucous membranes are moist.     Pharynx: No oropharyngeal exudate or posterior oropharyngeal erythema.  Eyes:     Extraocular Movements: Extraocular movements intact.     Conjunctiva/sclera: Conjunctivae normal.     Pupils: Pupils are equal, round, and reactive to light.  Cardiovascular:     Rate and Rhythm: Normal rate and regular rhythm.     Heart sounds: No murmur heard. Pulmonary:     Effort: Pulmonary effort is normal.     Breath sounds: Normal breath sounds. No stridor. No wheezing.  Abdominal:     General: Bowel sounds are normal. There is no distension.     Palpations: Abdomen is soft. There is no mass.     Tenderness: There is no abdominal tenderness. There is no guarding.  Musculoskeletal:     Cervical back: Neck supple.  Lymphadenopathy:     Cervical: No cervical adenopathy.  Skin:    Capillary Refill:  Capillary refill takes less than 2 seconds.     Coloration: Skin is not jaundiced or pale.  Neurological:     General: No focal deficit present.     Mental Status: He is alert and oriented to person, place, and time.  Psychiatric:        Behavior: Behavior normal.      UC Treatments / Results  Labs (all labs ordered are listed, but only abnormal results are displayed) Labs Reviewed  CBC WITH DIFFERENTIAL/PLATELET  COMPREHENSIVE METABOLIC PANEL WITH GFR    EKG   Radiology No results found.  Procedures Procedures (including critical care time)  Medications Ordered in UC Medications - No data to display  Initial Impression / Assessment and Plan / UC Course  I have reviewed the triage vital signs and the nursing notes.  Pertinent labs & imaging results that were available during my care of the patient were reviewed by me and considered in my medical decision making (see chart for details).     CBC and CMP are drawn to assess his symptoms. Omeprazole 40 mg is sent in to treat possible acid reflux.  He is given instructions on how to set up primary care appointments and he is given contact information for gastroenterology.  Final Clinical Impressions(s) / UC Diagnoses   Final diagnoses:  Epigastric pain  Vomiting, unspecified vomiting type, unspecified whether nausea present     Discharge Instructions      Take omeprazole 40 mg--1 capsule daily for stomach acid.  We have drawn blood to check your blood counts and your kidney and liver function numbers.  Staff will notify you if anything is significantly abnormal.  You can use the QR code/website at the back of the summary paperwork to schedule yourself a new patient appointment with primary care  If your symptoms worsen or become more persistent, please go to the emergency room for further evaluation       ED Prescriptions     Medication Sig Dispense Auth. Provider   omeprazole (PRILOSEC) 40 MG capsule  Take 1 capsule (40 mg total) by mouth daily. 30 capsule Vonna, Sharlet POUR, MD      PDMP  not reviewed this encounter.   Vonna Sharlet POUR, MD 01/20/24 [phone]

## 2024-01-20 NOTE — ED Triage Notes (Signed)
 Patient here today with c/o LUQ abd pain X 1-2 years. Pain usually comes after eating.

## 2024-01-26 ENCOUNTER — Encounter: Payer: Self-pay | Admitting: Gastroenterology

## 2024-01-31 ENCOUNTER — Telehealth (HOSPITAL_COMMUNITY): Payer: Self-pay

## 2024-01-31 DIAGNOSIS — G8929 Other chronic pain: Secondary | ICD-10-CM

## 2024-01-31 NOTE — Telephone Encounter (Signed)
 Referral completed and sent to provider whom appointment is already scheduled with. MyChart copy sent too.  WENDI Dixon CMA

## 2024-01-31 NOTE — Telephone Encounter (Signed)
 Pt called requesting referral to [facility]. Advised would likely need to set up PCP first, but would ask provider if referral could be placed. Verbalized understanding.  Please advise.

## 2024-01-31 NOTE — Telephone Encounter (Signed)
Referral created for GI

## 2024-03-09 ENCOUNTER — Ambulatory Visit: Admitting: Gastroenterology

## 2024-03-09 ENCOUNTER — Encounter: Payer: Self-pay | Admitting: Gastroenterology

## 2024-03-09 ENCOUNTER — Other Ambulatory Visit (INDEPENDENT_AMBULATORY_CARE_PROVIDER_SITE_OTHER)

## 2024-03-09 VITALS — BP 102/60 | HR 59 | Ht 72.0 in | Wt 122.0 lb

## 2024-03-09 DIAGNOSIS — R630 Anorexia: Secondary | ICD-10-CM | POA: Diagnosis not present

## 2024-03-09 DIAGNOSIS — R140 Abdominal distension (gaseous): Secondary | ICD-10-CM | POA: Diagnosis not present

## 2024-03-09 DIAGNOSIS — R1013 Epigastric pain: Secondary | ICD-10-CM

## 2024-03-09 MED ORDER — OMEPRAZOLE 40 MG PO CPDR: 40.0000 mg | DELAYED_RELEASE_CAPSULE | Freq: Every day | ORAL | 1 refills | Status: AC

## 2024-03-09 NOTE — Progress Notes (Signed)
 Chief Complaint: Dr. San Primary GI Doctor: Abdominal pain  HPI:  Patient is a  23  year old male patient with no significant past medical history who presents for a evaluation of abdominal pain  01/20/24 ED visit for abdominal pain.CBC and CMP are drawn to assess his symptoms. Only abnormal lab was elevated T. Bilirubin at 2.7. Omeprazole  40 mg is sent in to treat possible acid reflux. GI referral.  Interval History    Patient presents for evaluation of epigastric abdominal pain. Accompanied by mother. Patient reports he recently went to urgent care for abdominal pain with bloating.  Patient states his symptoms are worse with certain types of foods such as spicy food or greasy food.  Patient also avoids pork.  Patient was placed on omeprazole  40 mg daily which he states improved his symptoms about 60%.  Patient continues with poor appetite and has to force himself to have 1 meal per day.  Weight stable.  His mom also reports she has noticed a change in his appetite over the last 1 to 2 years.  Patient states he has history of nausea and vomiting but it has improved since starting the omeprazole .  Patient has 1 bowel movement daily.  No blood in stool.  No external stress.  No alcohol use.  Marijuana smokes daily.    No NSAIDs  Surgical history: none  [name] family history : No colon CA, No esophageal CA  Wt Readings from Last 3 Encounters:  03/09/24 122 lb (55.3 kg)  10/19/22 120 lb (54.4 kg)  10/15/16 107 lb (48.5 kg) (27%, Z= -0.60)*   * Growth percentiles are based on CDC (Boys, 2-20 Years) data.     History reviewed. No pertinent past medical history.  History reviewed. No pertinent surgical history.  Current Outpatient Medications  Medication Sig Dispense Refill   omeprazole  (PRILOSEC) 40 MG capsule Take 1 capsule (40 mg total) by mouth daily. 30 capsule 1   No current facility-administered medications for this visit.    Allergies as of 03/09/2024   (No  Known Allergies)    Family History  Problem Relation Age of Onset   Irritable bowel syndrome Mother    Diabetes Maternal Grandmother    Heart disease Maternal Grandfather     Review of Systems:    Constitutional: No weight loss, fever, chills, weakness or fatigue HEENT: Eyes: No change in vision               Ears, Nose, Throat:  No change in hearing or congestion Skin: No rash or itching Cardiovascular: No chest pain, chest pressure or palpitations   Respiratory: No SOB or cough Gastrointestinal: See HPI and otherwise negative Genitourinary: No dysuria or change in urinary frequency Neurological: No headache, dizziness or syncope Musculoskeletal: No new muscle or joint pain Hematologic: No bleeding or bruising Psychiatric: No history of depression or anxiety    Physical Exam:  Vital signs: BP 102/60 (BP Location: Left Arm, Patient Position: Sitting, Cuff Size: Normal)   Pulse (!) 59   Ht 6' (1.829 m)   Wt 122 lb (55.3 kg)   BMI 16.55 kg/m   Constitutional:   Pleasant male appears to be in NAD, Well developed, Well nourished, alert and cooperative Throat: Oral cavity and pharynx without inflammation, swelling or lesion.  Respiratory: Respirations even and unlabored. Lungs clear to auscultation bilaterally.   No wheezes, crackles, or rhonchi.  Cardiovascular: Normal S1, S2. Regular rate and rhythm. No peripheral edema, cyanosis or pallor.  Gastrointestinal:  Soft, nondistended, nontender. No rebound or guarding. Normal bowel sounds. No appreciable masses or hepatomegaly. Rectal:  Not performed.  Msk:  Symmetrical without gross deformities. Without edema, no deformity or joint abnormality.  Neurologic:  Alert and  oriented x4;  grossly normal neurologically.  Skin:   Dry and intact without significant lesions or rashes.  RELEVANT LABS AND IMAGING: CBC    Latest Ref Rng & Units 01/20/2024   12:20 PM 12/15/2021   10:12 AM 10/09/2015    1:15 PM  CBC  WBC 4.0 - 10.5 K/uL 6.6   9.8  5.0   Hemoglobin 13.0 - 17.0 g/dL 85.3  85.1  87.9   Hematocrit 39.0 - 52.0 % 43.8  44.5  36.0   Platelets 150 - 400 K/uL 277  262  286      CMP     Latest Ref Rng & Units 01/20/2024   12:20 PM 12/15/2021   10:12 AM 10/09/2015    1:15 PM  CMP  Glucose 70 - 99 mg/dL 94  99  90   BUN 6 - 20 mg/dL 15  15  11    Creatinine 0.61 - 1.24 mg/dL 9.23  9.05  9.44   Sodium 135 - 145 mmol/L 137  140  138   Potassium 3.5 - 5.1 mmol/L 3.9  3.5  4.0   Chloride 98 - 111 mmol/L 104  105  104   CO2 22 - 32 mmol/L 24  22  22    Calcium 8.9 - 10.3 mg/dL 9.2  9.7  9.7   Total Protein 6.5 - 8.1 g/dL 7.3  7.4    Total Bilirubin 0.0 - 1.2 mg/dL 2.7  2.8    Alkaline Phos 38 - 126 U/L 59  93    AST 15 - 41 U/L 16  19    ALT 0 - 44 U/L 11  14     11/2021 CTAP IMPRESSION: 1. Mildly indistinct appearance of the pancreas, could be related to technical factors and reduced though CT. Correlate with pancreatic enzymes. 2. Trace free fluid in the pelvis is of uncertain significance and is non loculated in appearance. Omer Puccinelli relate to underlying inflammation and could be seen in a variety of conditions including gastroenteritis. 3. Query trace intrahepatic biliary duct distension, significance uncertain versus mild periportal edema. 4. Normal appendix.  10/2022 abdominal xray IMPRESSION: Unremarkable KUB.   Assessment: Encounter Diagnoses  Name Primary?   Abdominal pain, epigastric Yes   Poor appetite    Bloating    Hyperbilirubinemia        23 year old who presents with epigastric pain and poor appetite.  Patient did see some improvement with starting omeprazole  p.o. daily.  Will go ahead and order abdominal ultrasound to rule out acute cause such as gallbladder disease.  Also recommended the patient discontinue marijuana use.  Will also check H. pylori Diatherix.  Due to poor appetite we will go ahead and proceed with upper GI endoscopy to rule out peptic ulcer disease and/or gastritis. Patient also  has evaded total bilirubin with normal LFTs.  Will go ahead and order fractionated bilirubin to rule out gilberts syndrome.  Plan: - Continue omeprazole  40 mg po daily, refilled -GERD diet, no late meals 3-4 hours before eating  -RUQ abdominal ultrasound -stop marijuana use -Check fractionated bilirubin  -Check H pylori diathereix stool  -Schedule Egd in [place] with Dr. San The risks and benefits of EGD with possible biopsies and esophageal dilation were discussed with the patient who  agrees to proceed.  Thank you for the courtesy of this consult. Please call me with any questions or concerns.   Mayvis Agudelo, FNP-C [facility] 03/09/2024, 11:16 AM  Cc: No ref. provider found

## 2024-03-09 NOTE — Patient Instructions (Addendum)
 Cannabinoid Hyperemesis Syndrome Review material  Recommend stop marijuana    GERD Recommend GERD diet, no late meals Continue omeprazole  40mg  po daily, take 30-45 minutes before first meal of day Avoid NSAID's (ibuprofen , Advil , goody powders)  Your provider has requested that you go to the basement level for lab work before leaving today. Press B on the elevator. The lab is located at the first door on the left as you exit the elevator.  Your provider has ordered Diatherix stool testing for you. You have received a kit from our office today containing all necessary supplies to complete this test. Please carefully read the stool collection instructions provided in the kit before opening the accompanying materials. In addition, be sure there is a label providing your full name and date of birth on the puritan opti-swab tube that is supplied in the kit (if you do not see a label with this information on your test tube, please make us  aware before test collection!). After completing the test, you should secure the purtian tube into the specimen biohazard bag. The [facility] E-Req sheet (including date and time of specimen collection) should be placed into the outside pocket of the specimen biohazard bag and returned to the [place] lab (basement floor of [facility]) within 3 days of collection. Please make sure to give the specimen to a staff member at the lab. DO NOT leave the specimen on the counter.   If the specimen date and time (can be found in the upper right boxed portion of the sheet) are not filled out on the E-Req sheet, the test will NOT be performed.   You have been scheduled for an abdominal ultrasound at [facility] (1st floor of hospital) on 03/16/24 at 10:00am. Please arrive 30 minutes prior to your appointment for registration. Make certain not to have anything to eat or drink 6 hours prior to your appointment. Should you need to  reschedule your appointment, please contact radiology at [phone]. This test typically takes about 30 minutes to perform.   You have been scheduled for an endoscopy. Please follow written instructions given to you at your visit today.  If you use inhalers (even only as needed), please bring them with you on the day of your procedure.  If you take any of the following medications, they will need to be adjusted prior to your procedure:   DO NOT TAKE 7 DAYS PRIOR TO TEST- Trulicity (dulaglutide) Ozempic, Wegovy (semaglutide) Mounjaro (tirzepatide) Bydureon Bcise (exanatide extended release)  DO NOT TAKE 1 DAY PRIOR TO YOUR TEST Rybelsus (semaglutide) Adlyxin (lixisenatide) Victoza (liraglutide) Byetta (exanatide) ___________________________________________________________________________  Due to recent changes in healthcare laws, you may see the results of your imaging and laboratory studies on MyChart before your provider has had a chance to review them.  We understand that in some cases there may be results that are confusing or concerning to you. Not all laboratory results come back in the same time frame and the provider may be waiting for multiple results in order to interpret others.  Please give us  48 hours in order for your provider to thoroughly review all the results before contacting the office for clarification of your results.   _______________________________________________________  If your blood pressure at your visit was 140/90 or greater, please contact your primary care physician to follow up on this.  _______________________________________________________  If you are age 24 or older, your body mass index should be between 23-30. Your Body mass index  is 16.55 kg/m. If this is out of the aforementioned range listed, please consider follow up with your Primary Care Provider.  If you are age 25 or younger, your body mass index should be between 19-25. Your Body mass  index is 16.55 kg/m. If this is out of the aformentioned range listed, please consider follow up with your Primary Care Provider.   ________________________________________________________  The [facility] providers would like to encourage you to use MYCHART to communicate with providers for non-urgent requests or questions.  Due to long hold times on the telephone, sending your provider a message by [facility] may be a faster and more efficient way to get a response.  Please allow 48 business hours for a response.  Please remember that this is for non-urgent requests.  _______________________________________________________  [facility] is using a team-based approach to care.  Your team is made up of your doctor and two to three APPS. Our APPS (Nurse Practitioners and Physician Assistants) work with your physician to ensure care continuity for you. They are fully qualified to address your health concerns and develop a treatment plan. They communicate directly with your gastroenterologist to care for you. Seeing the Advanced Practice Practitioners on your physician's team can help you by facilitating care more promptly, often allowing for earlier appointments, access to diagnostic testing, procedures, and other specialty referrals.   Thank you for trusting me with your gastrointestinal care. Deanna May, FNP-C

## 2024-03-10 LAB — BILIRUBIN, FRACTIONATED(TOT/DIR/INDIR)
Bilirubin, Direct: 0.2 mg/dL (ref 0.0–0.2)
Indirect Bilirubin: 1.2 mg/dL (ref 0.2–1.2)
Total Bilirubin: 1.4 mg/dL — ABNORMAL HIGH (ref 0.2–1.2)

## 2024-03-12 ENCOUNTER — Ambulatory Visit: Payer: Self-pay | Admitting: Gastroenterology

## 2024-03-16 ENCOUNTER — Ambulatory Visit (HOSPITAL_COMMUNITY)
Admission: RE | Admit: 2024-03-16 | Discharge: 2024-03-16 | Disposition: A | Discharge status: Home / Self Care | Source: Ambulatory Visit | Attending: Gastroenterology | Admitting: Gastroenterology

## 2024-03-16 DIAGNOSIS — R140 Abdominal distension (gaseous): Secondary | ICD-10-CM | POA: Diagnosis present

## 2024-03-16 DIAGNOSIS — R630 Anorexia: Secondary | ICD-10-CM | POA: Insufficient documentation

## 2024-03-16 DIAGNOSIS — R1013 Epigastric pain: Secondary | ICD-10-CM | POA: Diagnosis present

## 2024-03-19 ENCOUNTER — Ambulatory Visit: Payer: Self-pay | Admitting: Gastroenterology

## 2024-04-10 ENCOUNTER — Encounter: Payer: Self-pay | Admitting: Gastroenterology

## 2024-04-11 ENCOUNTER — Encounter: Payer: Self-pay | Admitting: Gastroenterology

## 2024-04-11 ENCOUNTER — Ambulatory Visit: Admitting: Gastroenterology

## 2024-04-11 VITALS — BP 116/67 | HR 73 | Temp 98.0°F | Resp 15 | Ht 72.0 in | Wt 122.0 lb

## 2024-04-11 DIAGNOSIS — K319 Disease of stomach and duodenum, unspecified: Secondary | ICD-10-CM | POA: Diagnosis not present

## 2024-04-11 DIAGNOSIS — K3189 Other diseases of stomach and duodenum: Secondary | ICD-10-CM

## 2024-04-11 DIAGNOSIS — R112 Nausea with vomiting, unspecified: Secondary | ICD-10-CM

## 2024-04-11 DIAGNOSIS — R630 Anorexia: Secondary | ICD-10-CM

## 2024-04-11 DIAGNOSIS — R1013 Epigastric pain: Secondary | ICD-10-CM

## 2024-04-11 MED ORDER — SODIUM CHLORIDE 0.9 % IV SOLN: 500.0000 mL | Freq: Once | INTRAVENOUS | Status: DC

## 2024-04-11 NOTE — Progress Notes (Signed)
 Called to room to assist during endoscopic procedure.  Patient ID and intended procedure confirmed with present staff. Received instructions for my participation in the procedure from the performing physician.

## 2024-04-11 NOTE — Progress Notes (Signed)
 Pt's states no medical or surgical changes since previsit or office visit.

## 2024-04-11 NOTE — Progress Notes (Signed)
 GASTROENTEROLOGY PROCEDURE H&P NOTE   Primary Care Physician: Patient, No Pcp Per    Reason for Procedure:  Epigastric pain, bloating, decreased appetite, nausea/vomiting  Plan:    EGD  Patient is appropriate for endoscopic procedure(s) in the ambulatory ([facility]) setting.  The nature of the procedure, as well as the risks, benefits, and alternatives were carefully and thoroughly reviewed with the patient. Ample time for discussion and questions allowed. The patient understood, was satisfied, and agreed to proceed.     HPI: Walter Mathews is a 23 y.o. male who presents for EGD for evaluation of epigastric pain, abdominal bloating, decreased appetite, and episodic nausea/vomiting.  Some overall improvement since starting omeprazole  40 mg daily.  H. pylori Diatherix ordered but not completed by patient.  Abdominal ultrasound unremarkable.  Otherwise no significant changes in clinical history since initial office appointment on 03/09/2024.  Past Medical History:  Diagnosis Date   GERD (gastroesophageal reflux disease)     Past Surgical History:  Procedure Laterality Date   UPPER GASTROINTESTINAL ENDOSCOPY      Prior to Admission medications   Medication Sig Start Date End Date Taking? Authorizing Provider  omeprazole  (PRILOSEC) 40 MG capsule Take 1 capsule (40 mg total) by mouth daily. 03/09/24  Yes May, Deanna J, NP    Current Outpatient Medications  Medication Sig Dispense Refill   omeprazole  (PRILOSEC) 40 MG capsule Take 1 capsule (40 mg total) by mouth daily. 30 capsule 1   Current Facility-Administered Medications  Medication Dose Route Frequency Provider Last Rate Last Admin   0.9 %  sodium chloride  infusion  500 mL Intravenous Once Klaus Casteneda V, DO        Allergies as of 04/11/2024   (No Known Allergies)    Family History  Problem Relation Age of Onset   Irritable bowel syndrome Mother    Diabetes Maternal Grandmother    Heart disease  Maternal Grandfather    Colon cancer Neg Hx    Esophageal cancer Neg Hx    Rectal cancer Neg Hx    Stomach cancer Neg Hx     Social History   Socioeconomic History   Marital status: Single    Spouse name: Not on file   Number of children: Not on file   Years of education: Not on file   Highest education level: Not on file  Occupational History   Not on file  Tobacco Use   Smoking status: Never   Smokeless tobacco: Never  Vaping Use   Vaping status: Never Used  Substance and Sexual Activity   Alcohol use: No   Drug use: Yes    Types: Marijuana   Sexual activity: Not on file  Other Topics Concern   Not on file  Social History Narrative   Not on file   Social Drivers of Health   Financial Resource Strain: Not on file  Food Insecurity: Not on file  Transportation Needs: Not on file  Physical Activity: Not on file  Stress: Not on file  Social Connections: Not on file  Intimate Partner Violence: Not on file    Physical Exam: Vital signs in last 24 hours: @BP  127/80   Pulse (!) 52   Temp 98 F (36.7 C) (Temporal)   Resp 14   Ht 6' (1.829 m)   Wt 122 lb (55.3 kg)   SpO2 100%   BMI 16.55 kg/m  GEN: NAD EYE: Sclerae anicteric ENT: MMM CV: Non-tachycardic Pulm: CTA b/l GI: Soft, NT/ND NEURO:  Alert & Oriented x 3   Sandor Flatter, DO [facility]   04/11/2024 1:12 PM

## 2024-04-11 NOTE — Progress Notes (Signed)
1250 Robinul 0.1 mg IV given due large amount of secretions upon assessment.  MD made aware, vss

## 2024-04-11 NOTE — Op Note (Signed)
 [facility] Patient Name: Walter Mathews Procedure Date: 04/11/2024 1:01 PM MRN: [id number] Endoscopist: Sandor Flatter , MD, [id number] Age: 23 Referring MD:  Date of Birth: [birth year] Gender: Male Account #: [id number] Procedure:                Upper GI endoscopy Indications:              Epigastric abdominal pain, Nausea with vomiting,                            Decreased appetite Medicines:                Monitored Anesthesia Care Procedure:                Pre-Anesthesia Assessment:                           - Prior to the procedure, a History and Physical                            was performed, and patient medications and                            allergies were reviewed. The patient's tolerance of                            previous anesthesia was also reviewed. The risks                            and benefits of the procedure and the sedation                            options and risks were discussed with the patient.                            All questions were answered, and informed consent                            was obtained. Prior Anticoagulants: The patient has                            taken no anticoagulant or antiplatelet agents. ASA                            Grade Assessment: II - A patient with mild systemic                            disease. After reviewing the risks and benefits,                            the patient was deemed in satisfactory condition to                            undergo the procedure.  After obtaining informed consent, the endoscope was                            passed under direct vision. Throughout the                            procedure, the patient's blood pressure, pulse, and                            oxygen saturations were monitored continuously. The                            Olympus Scope [id number] was introduced through the                            mouth, and advanced to the  second part of duodenum.                            The upper GI endoscopy was accomplished without                            difficulty. The patient tolerated the procedure                            well. Scope In: Scope Out: Findings:                 The examined esophagus was normal.                           The Z-line was regular and was found 42 cm from the                            incisors.                           The entire examined stomach was normal. Biopsies                            were taken with a cold forceps for Helicobacter                            pylori testing. Estimated blood loss was minimal.                           The examined duodenum was normal. Complications:            No immediate complications. Estimated Blood Loss:     Estimated blood loss was minimal. Impression:               - Normal esophagus.                           - Z-line regular, 42 cm from the incisors.                           -  Normal stomach. Biopsied.                           - Normal examined duodenum. Recommendation:           - Patient has a contact number available for                            emergencies. The signs and symptoms of potential                            delayed complications were discussed with the                            patient. Return to normal activities tomorrow.                            Written discharge instructions were provided to the                            patient.                           - Resume previous diet.                           - Continue present medications.                           - Await pathology results.                           - Recommend marijuana cessation.                           - Return to GI clinic PRN. Sandor Flatter, MD 04/11/2024 1:29:19 PM

## 2024-04-11 NOTE — Progress Notes (Signed)
 Report given to PACU, vss

## 2024-04-11 NOTE — Patient Instructions (Signed)
 Resume previous diet. Continue present medications. Awaiting pathology results. Recommend marijuana cessation.   YOU HAD AN ENDOSCOPIC PROCEDURE TODAY AT THE [facility]:   Refer to the procedure report that was given to you for any specific questions about what was found during the examination.  If the procedure report does not answer your questions, please call your gastroenterologist to clarify.  If you requested that your care partner not be given the details of your procedure findings, then the procedure report has been included in a sealed envelope for you to review at your convenience later.  YOU SHOULD EXPECT: Some feelings of bloating in the abdomen. Passage of more gas than usual.  Walking can help get rid of the air that was put into your GI tract during the procedure and reduce the bloating. If you had a lower endoscopy (such as a colonoscopy or flexible sigmoidoscopy) you may notice spotting of blood in your stool or on the toilet paper. If you underwent a bowel prep for your procedure, you may not have a normal bowel movement for a few days.  Please Note:  You might notice some irritation and congestion in your nose or some drainage.  This is from the oxygen used during your procedure.  There is no need for concern and it should clear up in a day or so.  SYMPTOMS TO REPORT IMMEDIATELY:  Following upper endoscopy (EGD)  Vomiting of blood or coffee ground material  New chest pain or pain under the shoulder blades  Painful or persistently difficult swallowing  New shortness of breath  Fever of 100F or higher  Black, tarry-looking stools  For urgent or emergent issues, a gastroenterologist can be reached at any hour by calling [phone]. Do not use MyChart messaging for urgent concerns.    DIET:  We do recommend a small meal at first, but then you may proceed to your regular diet.  Drink plenty of fluids but you should avoid alcoholic beverages for 24  hours.  ACTIVITY:  You should plan to take it easy for the rest of today and you should NOT DRIVE or use heavy machinery until tomorrow (because of the sedation medicines used during the test).    FOLLOW UP: Our staff will call the number listed on your records the next business day following your procedure.  We will call around 7:15- 8:00 am to check on you and address any questions or concerns that you may have regarding the information given to you following your procedure. If we do not reach you, we will leave a message.     If any biopsies were taken you will be contacted by phone or by letter within the next 1-3 weeks.  Please call us  at [phone] if you have not heard about the biopsies in 3 weeks.    SIGNATURES/CONFIDENTIALITY: You and/or your care partner have signed paperwork which will be entered into your electronic medical record.  These signatures attest to the fact that that the information above on your After Visit Summary has been reviewed and is understood.  Full responsibility of the confidentiality of this discharge information lies with you and/or your care-partner.

## 2024-04-12 ENCOUNTER — Telehealth: Payer: Self-pay | Admitting: Lactation Services

## 2024-04-12 NOTE — Telephone Encounter (Signed)
 No answer-- unable to leave message.

## 2024-04-16 LAB — SURGICAL PATHOLOGY

## 2024-04-24 ENCOUNTER — Ambulatory Visit: Payer: Self-pay | Admitting: Gastroenterology

## 2024-05-01 ENCOUNTER — Encounter: Payer: Self-pay | Admitting: Gastroenterology
